# Patient Record
Sex: Female | Born: 1960 | Race: White | Hispanic: No
Health system: Southern US, Community
[De-identification: ages and names within clinical notes are randomized; demographics above are authoritative.]

---

## 2016-09-29 ENCOUNTER — Other Ambulatory Visit: Payer: Self-pay | Admitting: Chiropractic Medicine

## 2016-09-29 ENCOUNTER — Ambulatory Visit
Admission: RE | Admit: 2016-09-29 | Discharge: 2016-09-29 | Disposition: A | Discharge status: Home / Self Care | Payer: 59 | Source: Ambulatory Visit | Attending: Chiropractic Medicine | Admitting: Chiropractic Medicine

## 2016-09-29 DIAGNOSIS — M542 Cervicalgia: Secondary | ICD-10-CM

## 2016-11-20 ENCOUNTER — Other Ambulatory Visit: Payer: Self-pay | Admitting: Chiropractic Medicine

## 2016-11-20 DIAGNOSIS — M542 Cervicalgia: Secondary | ICD-10-CM

## 2016-11-20 DIAGNOSIS — R200 Anesthesia of skin: Secondary | ICD-10-CM

## 2016-11-20 DIAGNOSIS — R202 Paresthesia of skin: Secondary | ICD-10-CM

## 2016-11-20 DIAGNOSIS — M79602 Pain in left arm: Secondary | ICD-10-CM

## 2016-11-22 ENCOUNTER — Ambulatory Visit
Admission: RE | Admit: 2016-11-22 | Discharge: 2016-11-22 | Disposition: A | Discharge status: Home / Self Care | Payer: 59 | Source: Ambulatory Visit | Attending: Chiropractic Medicine | Admitting: Chiropractic Medicine

## 2016-11-22 DIAGNOSIS — R200 Anesthesia of skin: Secondary | ICD-10-CM

## 2016-11-22 DIAGNOSIS — M79602 Pain in left arm: Secondary | ICD-10-CM

## 2016-11-22 DIAGNOSIS — M542 Cervicalgia: Secondary | ICD-10-CM

## 2016-11-22 DIAGNOSIS — R202 Paresthesia of skin: Secondary | ICD-10-CM

## 2017-12-10 IMAGING — DX DG CERVICAL SPINE COMPLETE 4+V
6 series · 6 of 6 positions shown · non-contrast
Comparison: None.

CLINICAL DATA: History of fall 1 month ago, neck pain, left arm
pain

EXAM:
CERVICAL SPINE - COMPLETE 4+ VIEW

[dg cervical spine complete (1 of 6)]
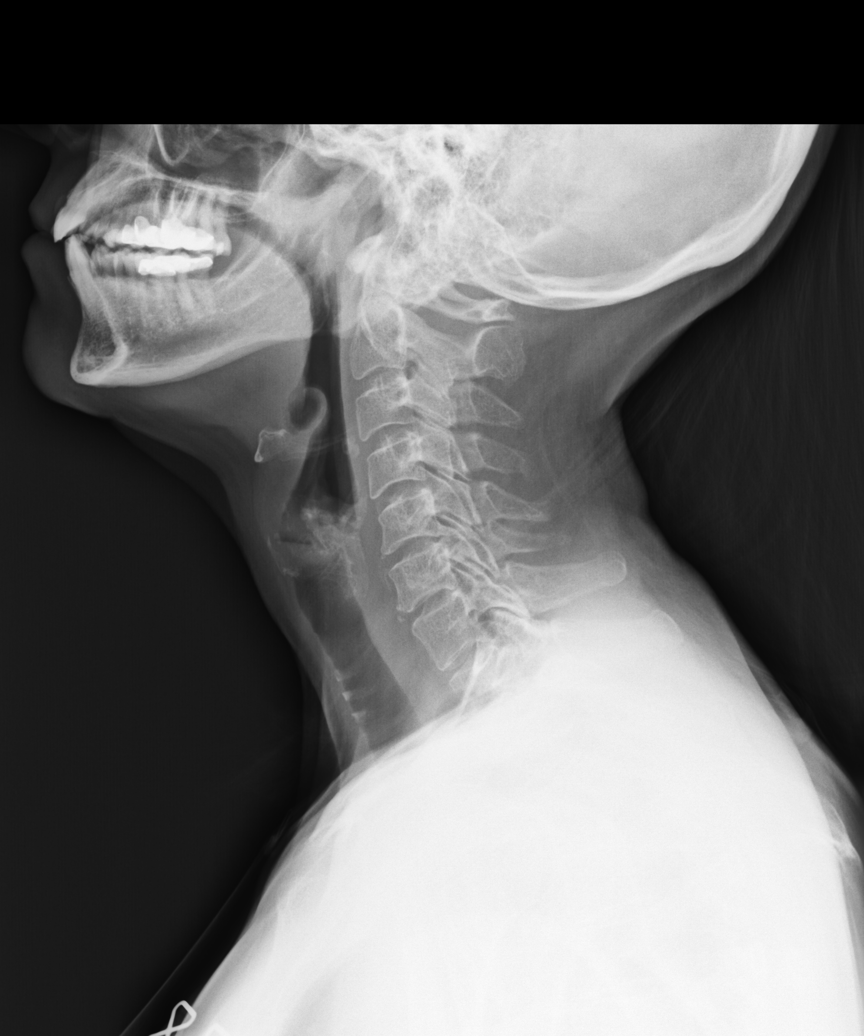

[dg cervical spine complete (2 of 6)]
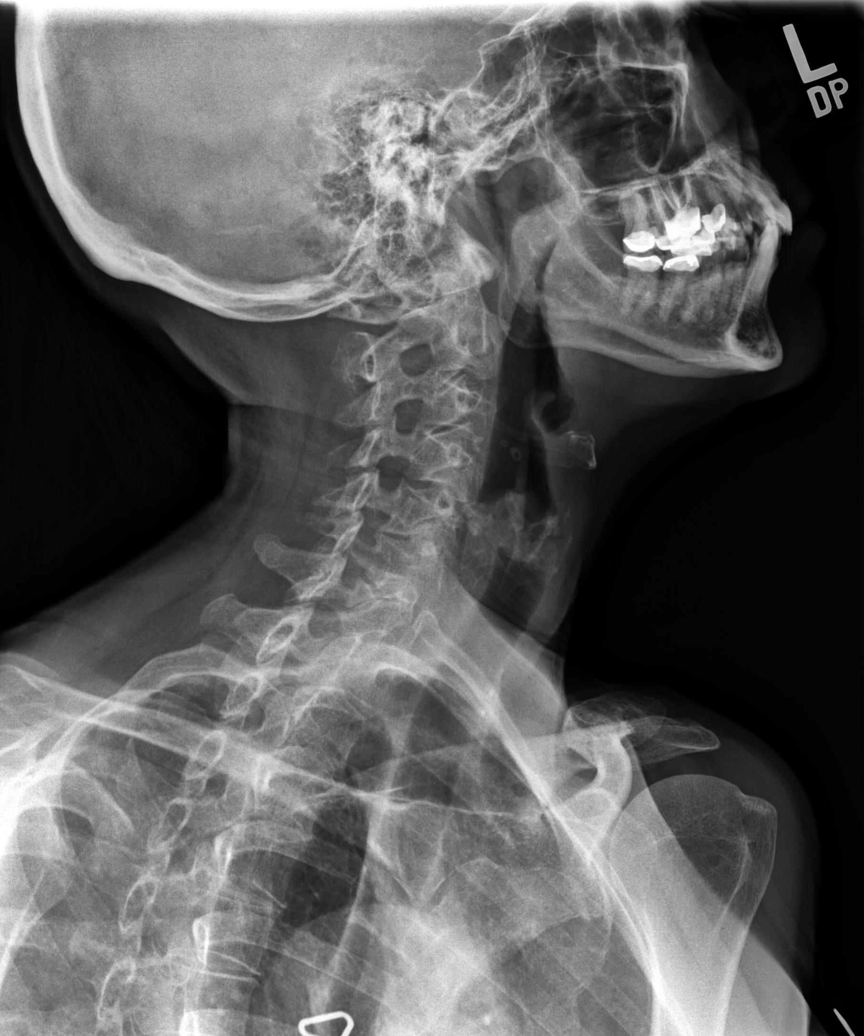

[dg cervical spine complete (3 of 6)]
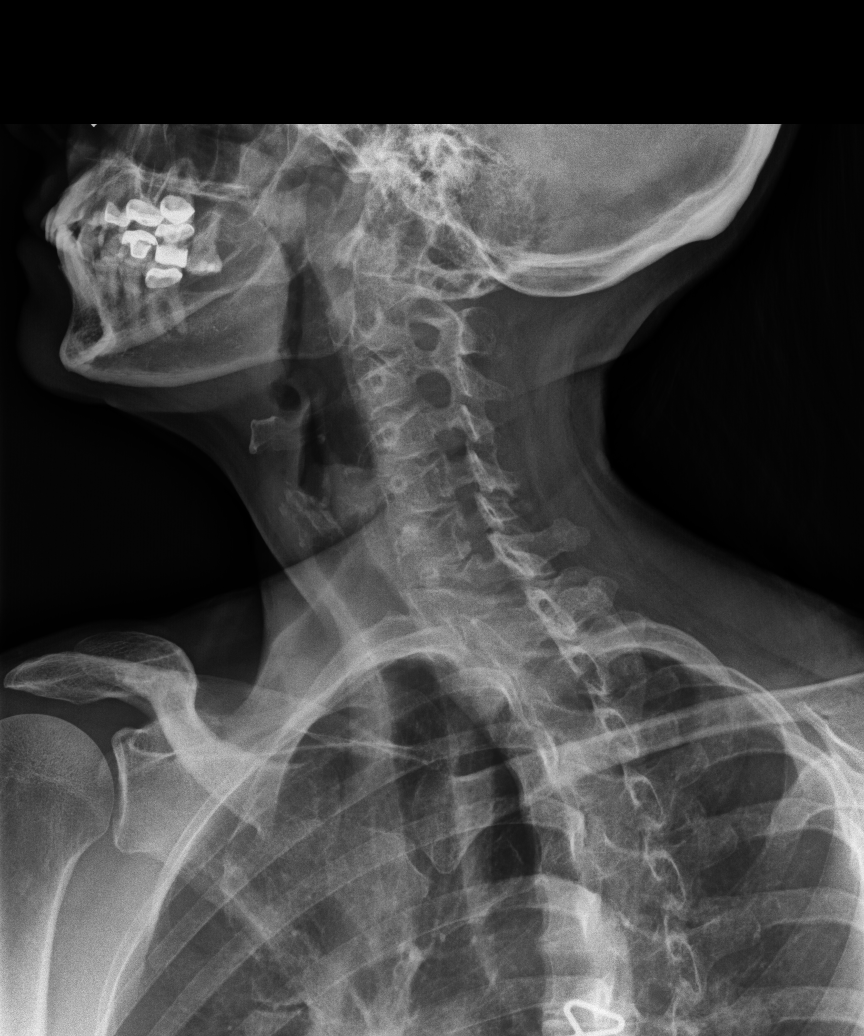

[dg cervical spine complete (4 of 6)]
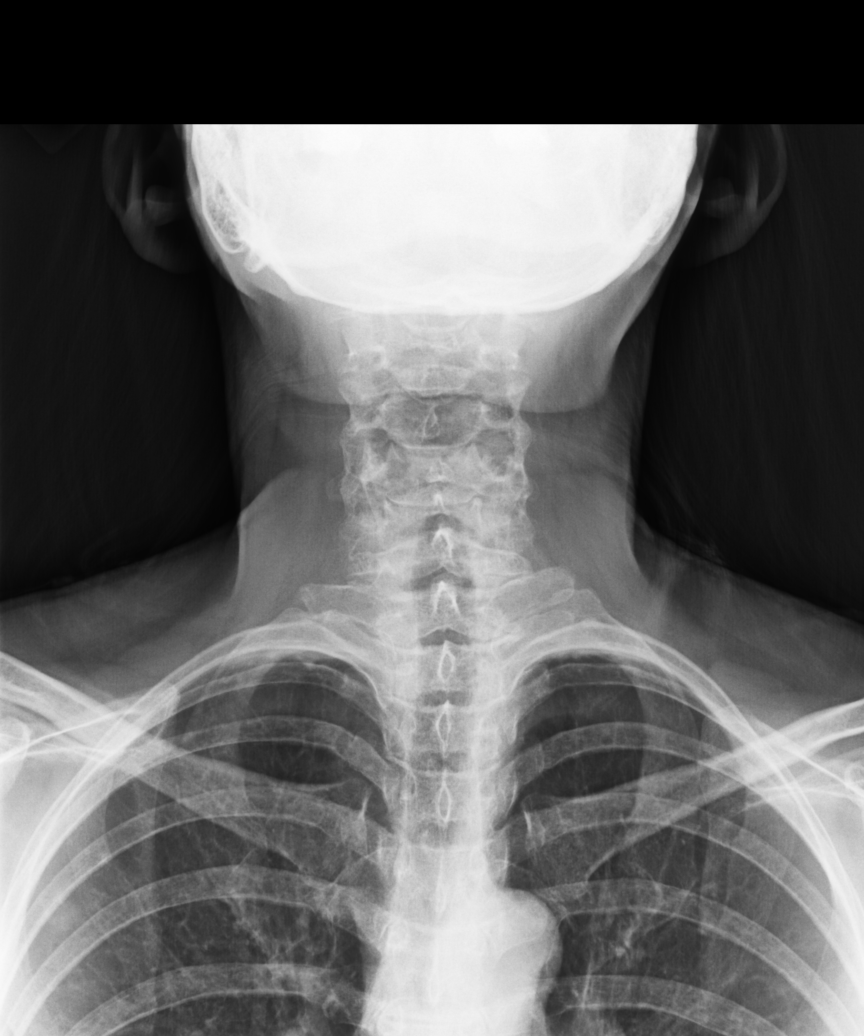

[dg cervical spine complete (5 of 6)]
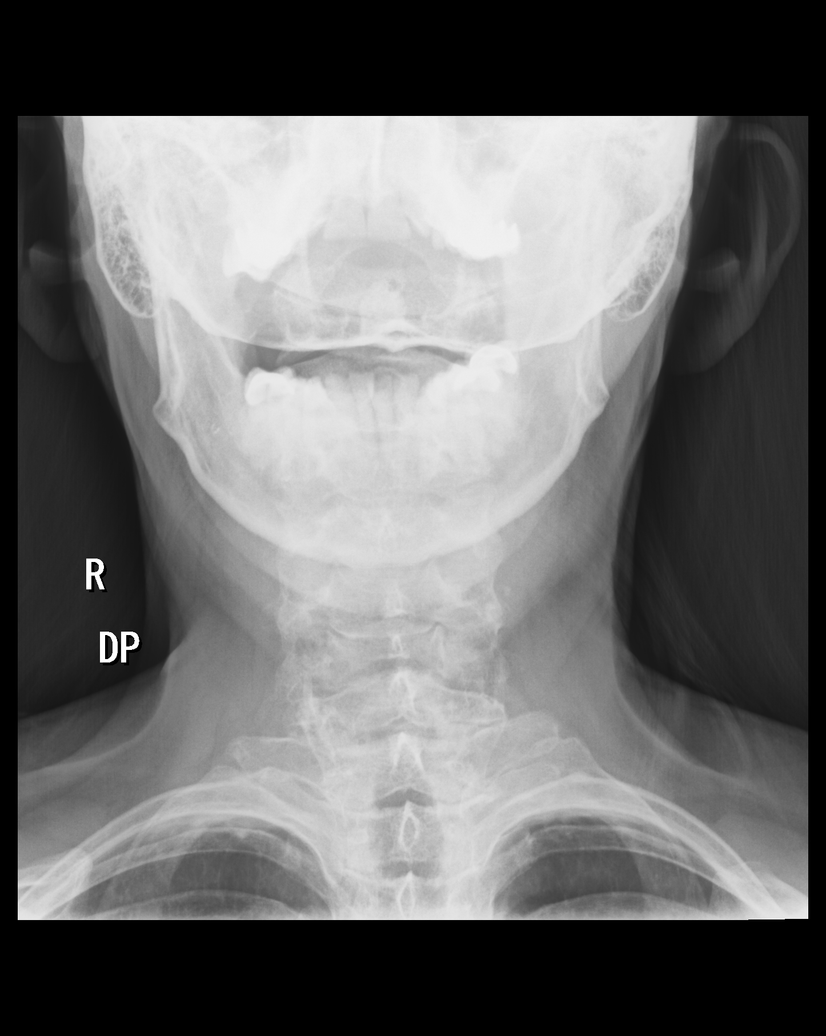

[dg cervical spine complete (6 of 6)]
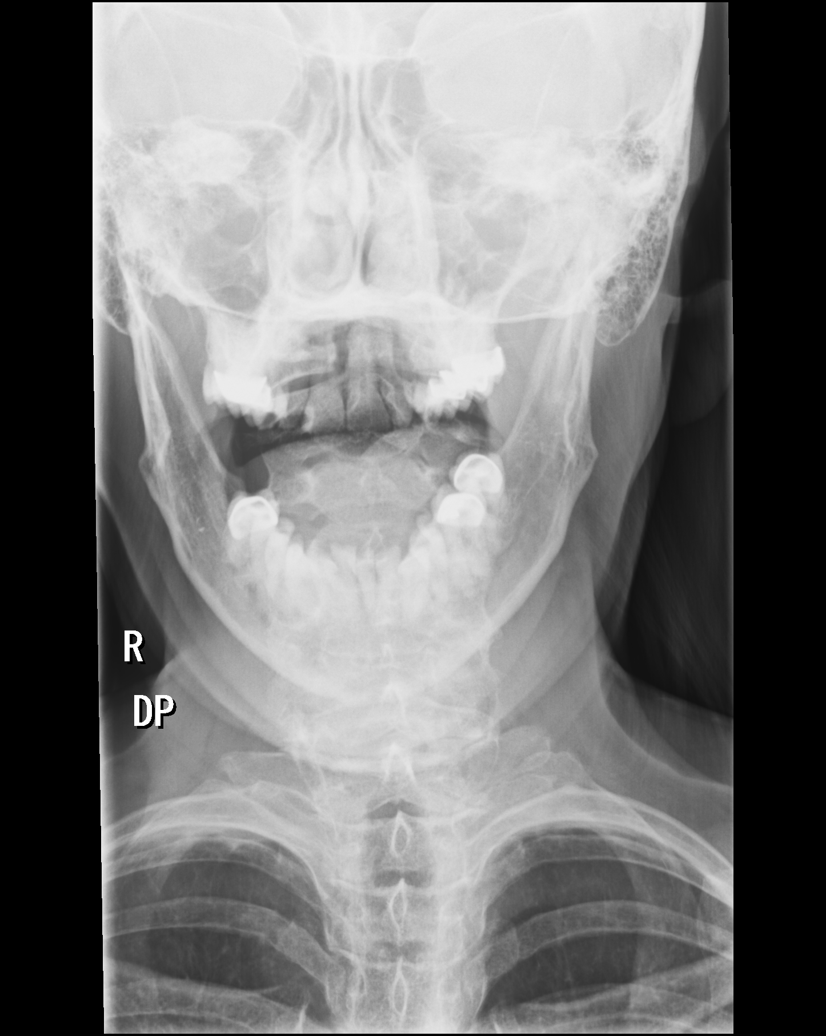

[6 of 6 positions shown; findings below may reference images not displayed]

FINDINGS: Six views of the cervical spine submitted. No acute fracture or
subluxation. Alignment and vertebral body heights are preserved.
Mild anterior spurring lower endplate of C5 and C6 vertebral body.
Mild disc space flattening at C6-C7 level. No prevertebral soft
tissue swelling. Cervical airway is patent. No neuro foramina
narrowing noted on oblique views. Mild disc space flattening at
C7-T1 level.
IMPRESSION: No acute fracture or subluxation. Minimal degenerative changes as
described above

## 2022-12-30 ENCOUNTER — Telehealth (INDEPENDENT_AMBULATORY_CARE_PROVIDER_SITE_OTHER): Payer: Self-pay | Admitting: Ophthalmology

## 2022-12-30 MED ORDER — OFLOXACIN 0.3 % OP SOLN: 1.0000 [drp] | Freq: Four times a day (QID) | OPHTHALMIC | 0 refills | Status: AC

## 2022-12-30 MED ORDER — ERYTHROMYCIN 5 MG/GM OP OINT: 1.0000 | TOPICAL_OINTMENT | Freq: Four times a day (QID) | OPHTHALMIC | 0 refills | Status: AC

## 2022-12-30 NOTE — Telephone Encounter (Signed)
[facility] pt called with injury to right eye from window squeegee. Vision intact and no eye deformation. Suspect corneal abrasion. Will call in Ofloxacin gtts and erythromycin ung -- both to be used QID OD. If symptoms worsen or fail to improve, pt to call back to be evaluated further. Pt in agreement with plan.  Karie Chimera, M.D., Ph.D. Diseases & Surgery of the Retina and Vitreous [facility]
